# Patient Record
Sex: Male | Born: 1995 | Race: Black or African American | Hispanic: No | Marital: Single
Health system: Southern US, Community
[De-identification: ages and names within clinical notes are randomized; demographics above are authoritative.]

---

## 2017-01-22 ENCOUNTER — Emergency Department (HOSPITAL_COMMUNITY)
Admission: EM | Admit: 2017-01-22 | Discharge: 2017-01-22 | Disposition: A | Discharge status: Home / Self Care | Payer: No Typology Code available for payment source | Attending: Emergency Medicine | Admitting: Emergency Medicine

## 2017-01-22 ENCOUNTER — Other Ambulatory Visit: Payer: Self-pay

## 2017-01-22 ENCOUNTER — Encounter (HOSPITAL_COMMUNITY): Payer: Self-pay

## 2017-01-22 ENCOUNTER — Emergency Department (HOSPITAL_COMMUNITY): Payer: No Typology Code available for payment source

## 2017-01-22 DIAGNOSIS — S62610A Displaced fracture of proximal phalanx of right index finger, initial encounter for closed fracture: Secondary | ICD-10-CM | POA: Insufficient documentation

## 2017-01-22 DIAGNOSIS — S6991XA Unspecified injury of right wrist, hand and finger(s), initial encounter: Secondary | ICD-10-CM | POA: Diagnosis present

## 2017-01-22 DIAGNOSIS — Y92410 Unspecified street and highway as the place of occurrence of the external cause: Secondary | ICD-10-CM | POA: Diagnosis not present

## 2017-01-22 DIAGNOSIS — Y939 Activity, unspecified: Secondary | ICD-10-CM | POA: Insufficient documentation

## 2017-01-22 DIAGNOSIS — Y998 Other external cause status: Secondary | ICD-10-CM | POA: Insufficient documentation

## 2017-01-22 MED ORDER — HYDROCODONE-ACETAMINOPHEN 5-325 MG PO TABS: 1.0000 | ORAL_TABLET | Freq: Four times a day (QID) | ORAL | 0 refills | Status: AC | PRN

## 2017-01-22 MED ORDER — HYDROCODONE-ACETAMINOPHEN 5-325 MG PO TABS
1.0000 | ORAL_TABLET | Freq: Once | ORAL | Status: AC
  Administered 2017-01-22: 1 via ORAL
  Filled 2017-01-22: qty 1
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
